# Patient Record
Sex: Male | Born: 1990 | Race: White | Hispanic: No | Marital: Single | Smoking: Never smoker
Health system: Southern US, Community
[De-identification: ages and names within clinical notes are randomized; demographics above are authoritative.]

## PROBLEM LIST (undated history)

## (undated) DIAGNOSIS — Q249 Congenital malformation of heart, unspecified: Secondary | ICD-10-CM

## (undated) DIAGNOSIS — L708 Other acne: Secondary | ICD-10-CM

## (undated) DIAGNOSIS — Q7150 Longitudinal reduction defect of unspecified ulna: Secondary | ICD-10-CM

## (undated) DIAGNOSIS — I639 Cerebral infarction, unspecified: Secondary | ICD-10-CM

## (undated) DIAGNOSIS — F909 Attention-deficit hyperactivity disorder, unspecified type: Secondary | ICD-10-CM

## (undated) DIAGNOSIS — M4120 Other idiopathic scoliosis, site unspecified: Secondary | ICD-10-CM

## (undated) HISTORY — DX: Longitudinal reduction defect of unspecified ulna: Q71.50

## (undated) HISTORY — DX: Congenital malformation of heart, unspecified: Q24.9

## (undated) HISTORY — PX: INGUINAL HERNIA REPAIR: SUR1180

## (undated) HISTORY — DX: Other acne: L70.8

## (undated) HISTORY — DX: Cerebral infarction, unspecified: I63.9

## (undated) HISTORY — DX: Other idiopathic scoliosis, site unspecified: M41.20

## (undated) HISTORY — PX: EYE SURGERY: SHX253

## (undated) HISTORY — PX: OTHER SURGICAL HISTORY: SHX169

## (undated) HISTORY — DX: Attention-deficit hyperactivity disorder, unspecified type: F90.9

## (undated) HISTORY — PX: WRIST SURGERY: SHX841

---
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
## 2008-03-24 ENCOUNTER — Emergency Department: Payer: Self-pay | Admitting: Emergency Medicine

## 2008-11-22 ENCOUNTER — Emergency Department: Payer: Self-pay | Admitting: Emergency Medicine

## 2010-11-01 NOTE — Discharge Summary (Signed)
NAME:  Roger Kirby, Roger Kirby                 ACCOUNT NO.:  [id number]   MEDICAL RECORD NO.:  [id number]          PATIENT TYPE:  IPS   LOCATION:  4005                         FACILITY:  [facility]   PHYSICIAN:  Ranelle Oyster, M.D.DATE OF BIRTH:  Nov 17, 1990   DATE OF ADMISSION:  06/06/2007  DATE OF DISCHARGE:  06/21/2007                               DISCHARGE SUMMARY   DISCHARGE DIAGNOSIS:  1. 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  2. Abnormal liver function tests.  3. Leukocytosis stable.  4. Wound dehiscence with some purulent drainage.   HISTORY OF PRESENT ILLNESS:  Mr. Roger Kirby is an 20 year old male with a  history of idiopathic scoliosis requiring T1-T12 fusion on December 3  approximately 36 hours postoperative the patient was noted to have acute  bilateral lower extremity, right greater than left, weakness with  numbness requiring hardware removal on 12/05.  CTA revealed high in  right ICA with suggestion of prior dissection.  Subtle increase was also  noted in cord edema at T2-1 x-rays.  CT of the aorta was done showing no  evidence of aortic dissection.  Currently the patient continues with  lower extremity weakness, and repeat MRI of the spine to be done in the  future to rule out infarct as no signs of vascular injury of current  workup.  The patient underwent reinsertion of posterior fusion from T1-  T12 on 12/11 and currently continues with weakness, right greater than  left lower extremity.  Sensation impaired from T10 down, bilateral  clonus noted.  Therapies are ongoing, and the patient is noted to have  elevated anxiety levels.  He has been able to sit at the edge of bed  with min-to-mod assist, and has been able to perform some transfers with  mod-assist.  Foley remains in place, currently.  Rehab consulted for  further therapies.   PAST MEDICAL HISTORY SIGNIFICANT FOR:  1. Repair of tetralogy of Fallot in 1984.  2. 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  3. Scoliosis with fusion on December 3.  4. Aberrant left subclavian artery.   ALLERGIES:  No known drug allergies.   FAMILY HISTORY:  Positive for seizures.   SOCIAL HISTORY:  The patient lives with grandmother and uncle in 1-level  home with 2-to-3 steps at entry.  He is a Holiday representative at [employer].  Does not use any tobacco or alcohol.  Family reports  history of ADHD.   HOSPITAL COURSE:  Mr. Roger Kirby was admitted to rehab on June 06, 2007 for inpatient therapies to consist of PT/OT daily.  Admission labs  done revealed hemoglobin 11.6, hematocrit 34.7, white count 11.1,  platelets 619.  Check of lytes revealed sodium 136, potassium 4.1,  chloride 100, CO2 26, BUN 10, and creatinine 0.52, glucose 114.  The  patient's back incision was noted to be healing well, and was noted to  be intact.  Sutures were noted to be intact, and these were deceased on  06/26/2007  without difficulty.  Foley was initially kept in  place.  Bowel program was initiated with Senna nightly and Dulcolax  suppository q.a.m. to avoid accidents.   Foley was discontinued on December 25 and voiding trial was initiated.  Initially the patient was noted to have problems voiding, and he was  started on Flomax 0.4 mg nightly in addition to Urecholine that has  slowly been titrated to 50 mg t.i.d.  By the time of discharge the  patient is voiding incontinently.  He has been able to self-cath for  high volumes.  Last PVR scans x2 checked have shown no signs of  retention.   On June 20, 2007 the patient was noted to have wound dehiscence  approximately 1-inch at lower aspect of his incision.  He was noted to  have some serosanguineous drainage with some purulence.  We have able to  express a moderate amount of purulent material from the sinus tract  central to with dehisced area.  The patient was started on Keflex 250 mg  b.i.d.  for wound prophylaxis and treatment.  Repeat CBC was done on  January 1 showing H&H at 12.7/37.6 with white count of 11.0, platelets  at 352.  UA/UC of December 30 was negative and no growth.  The patient  continues on Keflex for now.  Small amount of purulent drainage was  expressed from incision on January 2.  [facility], resident on  call, was contacted regarding ongoing issues regarding purulent drainage  as well as need for change of antibiotics.  They will follow up with the  patient in the walk-in clinic on June 23, 2006.  The patient is to  continue on Keflex for now.   The patient's family have been instructed for any issues regarding  fevers, chill, or mental status changes to contact [facility]  immediately for input on treatment.  Currently the patient continues  with lower extremity strength.  Grossly he is at 3 to 3+ strength in the  lower extremity.  He did feel deep pressure above knee, diminished  position sense above knee, and absent below knee.  Currently, the  patient is modified independent for static sitting balance, able to  reach 3 feet out of base support, max-assist with upper extremity  support for standing balance, and requires assist to keep the trunk  upright.  He is min-assist for sliding board transfers.  He is able to  stand with min-assist x2 for 1-2 minutes.  Currently the patient is at  supervision for upper body dressing at edge of bed, min-assist for  toileting, and for lower body dressing, mod-assist for hygiene.   Family education has been completed with father and uncle.  He will also  continue to receive further follow up home health PT/OT by [facility].  Home health RN has been set up for wound care, and wound  monitoring.  On June 20, 2006 the patient is discharged to home.   DISCHARGE MEDICATIONS:  1. Adaril 1 p.o.  2. Multivitamin 1 per day.  3. Senokot-S two p.o. nightly.  4. Keflex 250 mg p.o. q.6 h.  5. OxyIR 5 mg  one q.8-12 h. p.r.n. pain #30.  6. Neurontin 100 mg b.i.d.  7. Dulcolax suppository 1 per rectum q.a.m.  8. Baclofen 10 mg one-half p.o. b.i.d.  9. Urecholine 50 mg q.8 h.  10.Flomax 0.4 mg nightly.  11.Tylenol as needed for pain.   DIET:  Regular.   WOUND CARE:  1. Dressing changes b.i.d.  2. Push on lower aspect of incision to express all pus.  3. Cleanse the area with Betadine and apply dry dressing.   ACTIVITY LEVEL:  24-hour supervision assistance.  No alcohol, no  smoking, no driving.  Routine back precautions.  [facility] to  provide PT, OT, RN.   SPECIAL INSTRUCTIONS:  To call Kennon Rounds at Dr. Vern[facility] Shanks clinic regarding  walk-in time on Monday January 5.  The family has been instructed to  call at 8:00 a.m. at [phone], to call sooner for any complications.   FOLLOWUP:  The patient to followup with Dr. Riley Kill for routine check  February 6 at 1:20.      Greg Cutter, P.A.      Ranelle Oyster, M.D.  Electronically Signed    PP/MEDQ  D:  06/21/2007  T:  06/21/2007  Job:  [id number]

## 2010-11-01 NOTE — H&P (Signed)
NAME:  Roger Kirby, Roger Kirby                 ACCOUNT NO.:  [id number]   MEDICAL RECORD NO.:  [id number]          PATIENT TYPE:  IPS   LOCATION:  4005                         FACILITY:  [facility]   PHYSICIAN:  Ellwood Dense, M.D.   DATE OF BIRTH:  [birth year]   DATE OF ADMISSION:  06/06/2007  DATE OF DISCHARGE:                              HISTORY & PHYSICAL   PRIMARY CARE PHYSICIAN:  Dr. Alla Feeling at [facility].   HISTORY OF PRESENT ILLNESS:  Roger Kirby is an 20 year old Caucasian male  with history of idiopathic scoliosis requiring prior T1-T12 fusion  May 22, 2007, at [facility].   Approximately 36 hours after surgery, the patient developed acute  bilateral lower extremity, right greater than left, weakness and  numbness requiring hardware removal May 24, 2007.   CAT scan angiography revealed right internal carotid artery symptoms  suggestive of prior dissection.  It is a subtle increasing in cord edema  at T2 on x-ray.  He continued with lower extremity weakness, and a  repeat MRI scan was recommended, if there were further problems to rule  out infarct.  An MRI scan of the brain was done with unknown findings.   The patient underwent reinsertion of posterior fusion hardware T1-T12 on  May 30, 2007.  Her currently has weakness in the right lower  extremity more than the left lower extremity with sensation impaired  from approximately T8-T10 down.  Bilateral clonus was noted at the  ankles.  Physical and occupational therapy have been evaluating the  patient, and he continues with anxiety with poor ability to sit  unassisted, requiring min to mod assist for sitting, and mod assist for  transfers with increased clonus of the bilateral lower extremities.   The patient was evaluated by the rehabilitation physician, and felt to  be an appropriate candidate for inpatient rehabilitation.   REVIEW OF SYSTEMS:  Positive for incontinence, weakness and  numbness.   PAST MEDICAL HISTORY:  1. Repair of tetralogy of Fallot in 1984.  2. Left-sided radial aplasia, status post centralization in 1995, with      scoliosis with fusion May 22, 2007, at [facility], mixed      aberrant left subclavian artery.   FAMILY HISTORY:  Noncontributory.   SOCIAL HISTORY:  The patient reportedly lives with his grandmother and  uncle.  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  He does not use alcohol or tobacco.  He lives in 1- level home with 2-3  steps to enter.  He was independent, attending school prior to  admission.  He uses mostly his right upper extremity for all activities  of daily living, as his left upper extremities is only minimally  functional.   ALLERGIES:  No known drug allergies.   MEDICATIONS:  Prior to admission unknown.   LABORATORY:  Unknown.   PHYSICAL EXAM:  Anxious, occasionally tearful, young adult male, lying  in bed, in mild acute discomfort.  Blood pressure was 117/69 with pulse of 85, respiratory rate 20, O2  saturation 97% on room air, and  temperature 97.6.  HEENT:  Normocephalic, nontraumatic.  CARDIOVASCULAR:  Regular rate and rhythm, S1, S2 without murmurs.  ABDOMEN:  Soft, nontender, with positive bowel sounds.  LUNGS:  Clear to auscultation bilaterally.  NEUROLOGIC:  Alert, oriented x 2-3 with occasional cues.  Cranial nerve  exam showed intact cranial nerves II-XII.  Left upper extremity strength  was generally 3+/5 to 4-/5 with obvious deformity of his left upper  extremity, especially distally where he has a poorly formed elbow and  wrist, and minimally functional hand digits.  Right upper extremity  strength was generally 4+/5.  Lower extremity exam showed decreased  sensation below approximately T8-T10.  He has strength of 3-/5 in the  bilateral lower extremities.  He has bilateral clonus noted at the  ankles.  EXAMINATION OF HIS BACK:  Showed sutures in place without  significant  drainage, with the incision extending approximately 12-15 inches in his  mid back.   IMPRESSION:  1. Status post reinsertion of hardware May 30, 2007, for      treatment of thoracic scoliosis with history of prior surgery for      tetralogy of Fallot in 82.  2. 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  3. Anxiety/ behavior disorder.   Presently, the patient has deficits in ADLs, transfers and ambulation  related to the above noted thoracic scoliosis surgery.   PLAN:  1. Admit to the rehabilitation unit for daily therapies to include      physical therapy for range of motion, strengthening, bed mobility,      transfers, pre-gait training, gait training and equipment eval.  2. Occupational therapy for range of motion, strengthening, ADLs,      cognitive/perceptual training, splinting and equipment eval.  3. Rehab nursing for skin care, wound care, and bound abd bladder      training as necessary.  4. Case management to assess home environment, assist with discharge      planning and arrange for appropriate followup care.  5. Social work to assess family and social support, and assist in      discharge planning.  6. Continue regular pediatric diet.  7. Incentive spirometry q.2 hours while awake.  8. Oxycodone 5-mg elixir p.o. q.4 hours p.r.n. for pain.  9. PRAFO to the right lower extremity.  Senokot-S 2 tablets p.o.      nightly.  10.Dulcolax suppository 1 per rectum q. day at 6 a.m.  11.Multivitamin daily.  12.Discontinue sutures and Steri-Strips in incision is back June 08, 2007.  13.Adderall 10 mg p.o.  14.Cleanse incision in the back with normal saline daily, and apply      dry dressing until sutures removed.  15.Keep head of bed 20-30 degrees for patient's comfort.  16.Foley to straight drainage.  17.Lovenox 40 mg subcu q. day.  18.One-on-one counseling with  Dr. Leonides Cave for coping and anxiety.  19.Baclofen 5 mg p.o. b.i.d.  20.Orthostatic blood pressure and pulse q.a.m.  21.Vitals q. shift.  22.Thigh-high TEDs and abdominal binder for significant orthostasis      changes.   PROGNOSIS:  Fair.   ESTIMATED LENGTH OF STAY:  15-25 days.   GOALS:  Modified dependent ADLs and standby assist to min assist at  transfers and short-distance ambulation, and modified independent  wheelchair mobility.           ______________________________  Ellwood Dense, M.D.  DC/MEDQ  D:  06/07/2007  T:  06/08/2007  Job:  [id number]

## 2010-11-14 ENCOUNTER — Emergency Department: Payer: Self-pay | Admitting: Emergency Medicine

## 2011-02-10 ENCOUNTER — Emergency Department: Payer: Self-pay | Admitting: *Deleted

## 2011-03-09 LAB — CBC
MCHC: 33.7
MCV: 86.1
Platelets: 352
RBC: 4.37

## 2011-03-09 LAB — DIFFERENTIAL
Basophils Relative: 0
Eosinophils Absolute: 0.3
Monocytes Relative: 9
Neutrophils Relative %: 62

## 2011-03-24 LAB — CBC
Hemoglobin: 12 — ABNORMAL LOW
MCHC: 33.4
Platelets: 619 — ABNORMAL HIGH
RDW: 13.4
RDW: 13.6
WBC: 10

## 2011-03-24 LAB — COMPREHENSIVE METABOLIC PANEL
ALT: 170 — ABNORMAL HIGH
AST: 67 — ABNORMAL HIGH
Calcium: 8.4
GFR calc Af Amer: >60
Sodium: 136
Total Protein: 5.6 — ABNORMAL LOW

## 2011-03-24 LAB — URINALYSIS, ROUTINE W REFLEX MICROSCOPIC
Glucose, UA: NEGATIVE
Hgb urine dipstick: NEGATIVE
Ketones, ur: NEGATIVE
Protein, ur: NEGATIVE
Protein, ur: NEGATIVE
Urobilinogen, UA: 1

## 2011-03-24 LAB — URINE CULTURE
Colony Count: NO GROWTH
Colony Count: NO GROWTH
Culture: NO GROWTH
Culture: NO GROWTH

## 2011-03-24 LAB — DIFFERENTIAL
Basophils Absolute: 0
Basophils Relative: 0
Lymphocytes Relative: 13
Lymphs Abs: 1.5
Monocytes Relative: 8

## 2011-03-24 LAB — URINE MICROSCOPIC-ADD ON

## 2014-01-01 LAB — TSH: TSH: 1.85 u[IU]/mL (ref <=5.90)

## 2014-01-13 ENCOUNTER — Encounter: Payer: Self-pay | Admitting: *Deleted

## 2014-01-14 ENCOUNTER — Encounter (INDEPENDENT_AMBULATORY_CARE_PROVIDER_SITE_OTHER): Payer: Self-pay

## 2014-01-14 ENCOUNTER — Encounter: Payer: Self-pay | Admitting: Cardiology

## 2014-01-14 ENCOUNTER — Ambulatory Visit (INDEPENDENT_AMBULATORY_CARE_PROVIDER_SITE_OTHER): Payer: Medicare Other | Admitting: Cardiology

## 2014-01-14 VITALS — BP 142/82 | HR 73 | Ht 65.0 in | Wt 117.2 lb

## 2014-01-14 DIAGNOSIS — R0989 Other specified symptoms and signs involving the circulatory and respiratory systems: Secondary | ICD-10-CM

## 2014-01-14 DIAGNOSIS — I452 Bifascicular block: Secondary | ICD-10-CM

## 2014-01-14 DIAGNOSIS — R079 Chest pain, unspecified: Secondary | ICD-10-CM

## 2014-01-14 DIAGNOSIS — Q249 Congenital malformation of heart, unspecified: Secondary | ICD-10-CM

## 2014-01-14 DIAGNOSIS — R011 Cardiac murmur, unspecified: Secondary | ICD-10-CM

## 2014-01-14 DIAGNOSIS — R0609 Other forms of dyspnea: Secondary | ICD-10-CM

## 2014-01-14 DIAGNOSIS — R0600 Dyspnea, unspecified: Secondary | ICD-10-CM

## 2014-01-14 NOTE — Patient Instructions (Signed)
We will call you with follow up instructions

## 2014-01-14 NOTE — Progress Notes (Signed)
PATIENT: Roger Holmsavid Densmore Jr. MRN: [id number] DOB: 03/20/1991 PCP: Tommy RainwaterSOWLES,KRICKNA F, MD  Clinic Note: Chief Complaint  Patient presents with  . other    C/o chest pain and sob. Meds reviewed verbally with pt.    HPI: Roger HolmsDavid Hissong Jr. is a 23 y.o. male with a PMH below who presents today for evaluation of episodic chest pain and dyspnea. He has a history of some congenital birth defects one of which involves the left arm the other apparently was congenital heart defect. Unfortunately the family is unable to provide any details as to what type of heart surgery he had at age 89. They don't describe any symptoms of TET spells, but on reviewing some of his chart there was a suspicion of tetralogy. He essentially did not see his cardiologist since age 89.  Interval History: He is referred by his primary physician for her some vague symptoms of the head and chest discomfort could not really specific. He tells me that every now and then he gets short of breath whether he is doing any activity or not. Is not associated with PND or orthopnea although sometimes he can feel that. Sometimes will feel brief episodes of discomfort and heaviness in his chest but not again associated with exercise or particular activity. He'll occasionally feel some palpitations but no prolonged arrhythmia type rapid or irregular heartbeats. Additionally he also notes that he intermittently feels as though he has poor energy. I reviewed his sleep habits and does appear that he is not getting enough sleep the course of the night either from staying up late or waking up early. He denies any PND, orthopnea or edema there is persistent. No syncope or near syncope or TIA/amaurosis fugax symptoms.  Past Medical History  Diagnosis Date  . Scoliosis (and kyphoscoliosis), idiopathic   . 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 Attention deficit disorder with  hyperactivity(314.01)   . Other acne   . CVA (cerebrovascular accident)     after scollosis repair  . 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 Harrington rods    . Inguinal hernia repair    . Congenital heart surgery  1994    Questionable tetralogy of flow repair versus VSD  . Wrist surgery      No Known Allergies  No current outpatient prescriptions on file.   No current facility-administered medications for this visit.   History   Social History Narrative   He is single young man who never smoked. His father does smoke. He works at [employer].    family history includes Heart attack in his father; Hyperlipidemia in his father; Hypertension in his father.  ROS: A comprehensive Review of Systems was performed -  Review of Systems  Constitutional: Positive for malaise/fatigue.  Respiratory: Positive for shortness of breath.   Cardiovascular: Positive for chest pain and palpitations. Negative for claudication and leg swelling.  Neurological: Negative for speech change, focal weakness and weakness.  All other systems reviewed and are negative.  PHYSICAL EXAM BP 142/82  Pulse 73  Ht 5\' 5"  (1.651 m)  Wt 117 lb 4 oz (53.184 kg)  BMI 19.51 kg/m2 General appearance: alert, cooperative, appears stated age, no distress and Well-nourished and well-groomed Neck: no adenopathy,  no carotid bruit, no JVD, supple, symmetrical, trachea midline and thyroid not enlarged, symmetric, no tenderness/mass/nodules Lungs: clear to auscultation bilaterally, normal percussion bilaterally and Nonlabored, good air movement Heart: RRR with normal S1 and S2. He does have a significant murmur that appears to be holosystolic loudest at the left sternal border. It is at least 306. I was not able to elicit  any changes in the murmur with Valsalva or deep inspiration. His chest cavity appears displaced superiorly where his upper ribs are very high. He did does appear to be some mild protuberance of the left parasternal rib cage suggesting a possible RV enlargement. I do not feel RV heave. Abdomen: soft, non-tender; bowel sounds normal; no masses,  no organomegaly Extremities: Jeremy Johann are normal with no edema. 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   Pulses: 2+ and symmetric Neurologic: Alert and oriented X 3, normal strength and tone. Normal symmetric reflexes. Normal coordination and gait   Adult ECG Report  Rate: 73 ;  Rhythm: normal sinus rhythm and With RBBB and LAFB  Recent Labs: None  ASSESSMENT / PLAN: Variation in young man atypical sounding mild symptoms. My biggest concern is that he has not had any cardiac evaluation since age 26. He's had some type of congenital heart surgery of which I am not sure. He probably needs to have an echocardiogram checked just to make sure that his dyspnea and chest discomfort is not related to something from his heart surgery unfortunately we do not have either echocardiography technologist or cardiologists to accurately performed or read congenital heart defect echocardiography. His surgery was performed at [facility] and was followed initially by [facility]. I contacted their office and plan to refer him to be seen by one of the pediatric cardiologists and likely have an echocardiogram performed and read by them. Until I know for sure that we are not dealing with something that relates to his congenital defect and surgery, I do not feel comfortable as an adult cardiologist managing this young man.  He and his father agree that this may be the best course of action.  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 In light of his symptoms of dyspnea and chest discomfort I think it warrants at least an echocardiogram. Will Refer to [facility] for establishing followup care and echocardiographic evaluation.  They will have to get his charts out of archive.  Heart murmur, systolic He definitely has a significant murmur not sure, systoles into his previous surgery or if this is something new. Warrants echocardiographic evaluation by someone trained in reading pediatric echocardiography.  Dyspnea Episodic and not necessarily exertional.  This doesn't seem to bother him for a much, but is somewhat unusual in someone 23 years old  Pain in the chest I doubt very seriously this is anginal pain. I would like to know more about his cardiac function and this rupture before even considering some type of stress test evaluation    Orders Placed This Encounter  Procedures  . EKG 12-Lead    Order Specific Question:  Where should this test be performed    Answer:  [facility]   No orders of the defined types were placed in this encounter.    Followup: Will likely not followup with me depending on how his pediatric  cardiology evaluation goes   Jabarie W. Herbie Baltimore, M.D., M.S. Interventional Cardiolgy [facility]

## 2014-01-16 ENCOUNTER — Encounter: Payer: Self-pay | Admitting: Cardiology

## 2014-01-16 DIAGNOSIS — R0600 Dyspnea, unspecified: Secondary | ICD-10-CM | POA: Insufficient documentation

## 2014-01-16 DIAGNOSIS — R011 Cardiac murmur, unspecified: Secondary | ICD-10-CM | POA: Insufficient documentation

## 2014-01-16 DIAGNOSIS — I452 Bifascicular block: Secondary | ICD-10-CM | POA: Insufficient documentation

## 2014-01-16 DIAGNOSIS — R079 Chest pain, unspecified: Secondary | ICD-10-CM | POA: Insufficient documentation

## 2014-01-16 NOTE — Assessment & Plan Note (Signed)
He definitely has a significant murmur not sure, systoles into his previous surgery or if this is something new. Warrants echocardiographic evaluation by someone trained in reading pediatric echocardiography.

## 2014-01-16 NOTE — Assessment & Plan Note (Signed)
No evaluation since age 89. In light of his symptoms of dyspnea and chest discomfort I think it warrants at least an echocardiogram. Will [facility] for establishing followup care and echocardiographic evaluation.  They will have to get his charts out of archive.

## 2014-01-16 NOTE — Assessment & Plan Note (Signed)
I doubt very seriously this is anginal pain. I would like to know more about his cardiac function and this rupture before even considering some type of stress test evaluation

## 2014-01-16 NOTE — Assessment & Plan Note (Signed)
Episodic and not necessarily exertional.  This doesn't seem to bother him for a much, but is somewhat unusual in someone 23 years old

## 2014-01-19 ENCOUNTER — Telehealth: Payer: Self-pay | Admitting: *Deleted

## 2014-01-19 NOTE — Telephone Encounter (Signed)
Patient called regarding referral to pediatric cardiologist.

## 2014-01-20 NOTE — Telephone Encounter (Signed)
-   yeah - @ [facility] in 1994.  Plan was just to refer [facility]. Can just call the # you gave me - then fax his face sheet to them.  Marykay LexHARDING,Kennieth W, MD

## 2014-01-20 NOTE — Telephone Encounter (Signed)
Contacted [facility]  They informed me that Dr. Vanetta ShawlFeins performed the patients surgery  His [facility] med rec# is [id number] They stated patient needed to be referred to [facility] I called the number given [phone]  They asked me to fax the patients information to [phone] They stated that they would contact patient to set up the appt   I contacted patient to give him this information  I gave him the phone number for [facility] and instructed him to call if he did not hear form them in a few days for an appt  Instructed patient to call me with any issues  Patient verbalized understanding

## 2014-01-20 NOTE — Telephone Encounter (Signed)
Great - that makes sense.  Not exactly what they told me - but makes more sense .  Thanks.  DH

## 2014-03-16 ENCOUNTER — Emergency Department: Payer: Self-pay | Admitting: Emergency Medicine

## 2014-03-16 LAB — BASIC METABOLIC PANEL
Anion Gap: 7 (ref 7–16)
BUN: 13 mg/dL (ref 7–18)
CALCIUM: 8.7 mg/dL (ref 8.5–10.1)
CHLORIDE: 105 mmol/L (ref 98–107)
CREATININE: 1 mg/dL (ref 0.60–1.30)
Co2: 28 mmol/L (ref 21–32)
EGFR (African American): >60
GLUCOSE: 114 mg/dL — AB (ref 65–99)
Osmolality: 280 (ref 275–301)
POTASSIUM: 4.3 mmol/L (ref 3.5–5.1)
Sodium: 140 mmol/L (ref 136–145)

## 2014-03-16 LAB — CBC
HCT: 45.7 % (ref 40.0–52.0)
HGB: 15.2 g/dL (ref 13.0–18.0)
MCH: 29 pg (ref 26.0–34.0)
MCHC: 33.3 g/dL (ref 32.0–36.0)
MCV: 87 fL (ref 80–100)
PLATELETS: 245 10*3/uL (ref 150–440)
RBC: 5.25 10*6/uL (ref 4.40–5.90)
RDW: 12.9 % (ref 11.5–14.5)
WBC: 12 10*3/uL — ABNORMAL HIGH (ref 3.8–10.6)

## 2014-03-16 LAB — D-DIMER(ARMC): D-Dimer: 98 ng/ml

## 2014-03-16 LAB — TROPONIN I: Troponin-I: <0.02 ng/mL

## 2014-07-27 ENCOUNTER — Encounter (HOSPITAL_COMMUNITY): Payer: Self-pay | Admitting: *Deleted

## 2014-07-27 ENCOUNTER — Emergency Department (HOSPITAL_COMMUNITY)
Admission: EM | Admit: 2014-07-27 | Discharge: 2014-07-27 | Disposition: A | Discharge status: Home / Self Care | Payer: Medicare Other | Attending: Emergency Medicine | Admitting: Emergency Medicine

## 2014-07-27 ENCOUNTER — Emergency Department (HOSPITAL_COMMUNITY): Payer: Medicare Other

## 2014-07-27 DIAGNOSIS — Z79899 Other long term (current) drug therapy: Secondary | ICD-10-CM | POA: Diagnosis not present

## 2014-07-27 DIAGNOSIS — R200 Anesthesia of skin: Secondary | ICD-10-CM | POA: Diagnosis not present

## 2014-07-27 DIAGNOSIS — Q7150 Longitudinal reduction defect of unspecified ulna: Secondary | ICD-10-CM | POA: Insufficient documentation

## 2014-07-27 DIAGNOSIS — Z8774 Personal history of (corrected) congenital malformations of heart and circulatory system: Secondary | ICD-10-CM | POA: Diagnosis not present

## 2014-07-27 DIAGNOSIS — M5489 Other dorsalgia: Secondary | ICD-10-CM | POA: Diagnosis not present

## 2014-07-27 DIAGNOSIS — Z791 Long term (current) use of non-steroidal anti-inflammatories (NSAID): Secondary | ICD-10-CM | POA: Diagnosis not present

## 2014-07-27 DIAGNOSIS — M62830 Muscle spasm of back: Secondary | ICD-10-CM

## 2014-07-27 DIAGNOSIS — R569 Unspecified convulsions: Secondary | ICD-10-CM | POA: Diagnosis present

## 2014-07-27 LAB — COMPREHENSIVE METABOLIC PANEL
ALBUMIN: 4.1 g/dL (ref 3.5–5.2)
ALT: 28 U/L (ref 0–53)
ANION GAP: 3 — AB (ref 5–15)
AST: 24 U/L (ref 0–37)
Alkaline Phosphatase: 78 U/L (ref 39–117)
BILIRUBIN TOTAL: 0.4 mg/dL (ref 0.3–1.2)
BUN: 16 mg/dL (ref 6–23)
CHLORIDE: 106 mmol/L (ref 96–112)
CO2: 28 mmol/L (ref 19–32)
Calcium: 9.2 mg/dL (ref 8.4–10.5)
Creatinine, Ser: 1 mg/dL (ref 0.50–1.35)
GFR calc Af Amer: >90 mL/min (ref >90)
Glucose, Bld: 121 mg/dL — ABNORMAL HIGH (ref 70–99)
POTASSIUM: 3.8 mmol/L (ref 3.5–5.1)
Sodium: 137 mmol/L (ref 135–145)
TOTAL PROTEIN: 7.1 g/dL (ref 6.0–8.3)

## 2014-07-27 LAB — CBC WITH DIFFERENTIAL/PLATELET
BASOS PCT: 0 % (ref 0–1)
Basophils Absolute: 0.1 10*3/uL (ref 0.0–0.1)
EOS PCT: 4 % (ref 0–5)
Eosinophils Absolute: 0.5 10*3/uL (ref 0.0–0.7)
HEMATOCRIT: 40.5 % (ref 39.0–52.0)
HEMOGLOBIN: 14 g/dL (ref 13.0–17.0)
Lymphocytes Relative: 20 % (ref 12–46)
Lymphs Abs: 2.9 10*3/uL (ref 0.7–4.0)
MCH: 29.1 pg (ref 26.0–34.0)
MCHC: 34.6 g/dL (ref 30.0–36.0)
MCV: 84.2 fL (ref 78.0–100.0)
MONOS PCT: 10 % (ref 3–12)
Monocytes Absolute: 1.5 10*3/uL — ABNORMAL HIGH (ref 0.1–1.0)
NEUTROS ABS: 9.4 10*3/uL — AB (ref 1.7–7.7)
Neutrophils Relative %: 66 % (ref 43–77)
Platelets: 272 10*3/uL (ref 150–400)
RBC: 4.81 MIL/uL (ref 4.22–5.81)
RDW: 12.8 % (ref 11.5–15.5)
WBC: 14.4 10*3/uL — AB (ref 4.0–10.5)

## 2014-07-27 LAB — URINALYSIS, ROUTINE W REFLEX MICROSCOPIC
Bilirubin Urine: NEGATIVE
GLUCOSE, UA: NEGATIVE mg/dL
Hgb urine dipstick: NEGATIVE
KETONES UR: NEGATIVE mg/dL
Leukocytes, UA: NEGATIVE
Nitrite: NEGATIVE
PH: 6.5 (ref 5.0–8.0)
PROTEIN: NEGATIVE mg/dL
SPECIFIC GRAVITY, URINE: 1.023 (ref 1.005–1.030)
UROBILINOGEN UA: 1 mg/dL (ref 0.0–1.0)

## 2014-07-27 MED ORDER — NAPROXEN 500 MG PO TABS: 500.0000 mg | ORAL_TABLET | Freq: Two times a day (BID) | ORAL | Status: AC

## 2014-07-27 MED ORDER — CYCLOBENZAPRINE HCL 10 MG PO TABS
5.0000 mg | ORAL_TABLET | Freq: Once | ORAL | Status: AC
  Administered 2014-07-27: 5 mg via ORAL
  Filled 2014-07-27: qty 1

## 2014-07-27 MED ORDER — CYCLOBENZAPRINE HCL 5 MG PO TABS
5.0000 mg | ORAL_TABLET | Freq: Three times a day (TID) | ORAL | Status: AC | PRN
Start: 2014-07-27 — End: ?

## 2014-07-27 MED ORDER — OXYCODONE-ACETAMINOPHEN 5-325 MG PO TABS
1.0000 | ORAL_TABLET | Freq: Once | ORAL | Status: AC
  Administered 2014-07-27: 1 via ORAL
  Filled 2014-07-27: qty 1

## 2014-07-27 MED ORDER — OXYCODONE-ACETAMINOPHEN 5-325 MG PO TABS: 1.0000 | ORAL_TABLET | Freq: Four times a day (QID) | ORAL | Status: AC | PRN

## 2014-07-27 NOTE — ED Notes (Signed)
The pt had a seizure earlier tonight noiw has numbness rt arm and rt leg.  No seizure since he was a child.  Stroke as a baby.

## 2014-07-27 NOTE — ED Provider Notes (Signed)
CSN: [id number]     Arrival date & time 07/27/14  0026 History   First MD Initiated Contact with Patient 07/27/14 [id number]     Chief Complaint  Patient presents with  . Seizures     (Consider location/radiation/quality/duration/timing/severity/associated sxs/prior Treatment) HPI  Pt presenting with c/o low back pain and muscle spasm in his back.  He states that he was seated on the edge of the couch and had a spasm of pain in his back- this caused him to fall to the floor.  He felt that his whole body was "clenched up" and he was not able to move due to the spasm.  Also c/o numbness in right hand and right foot.  No weakness of legs. He has hx of scoliosis and subsequent repair.  He states he has had similar symptoms in the past with numbness and spasms that improve on their own.  He has some residual weakness in legs after spine surgery but no change in this.  No seizure activity.  He states he has had hx of CVA- however per chart review he has had ?spinal infarct after surgery- no actual CVA.  Pt is currently back at his baseline on exam in the ED.  No fever.  Pain is in his lower back and right lower back feels tight like a spasm per patient.  There are no other associated systemic symptoms, there are no other alleviating or modifying factors.   Past Medical History  Diagnosis Date  . Scoliosis (and kyphoscoliosis), idiopathic   . 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 Attention deficit disorder with hyperactivity(314.01)   . Other acne   . CVA (cerebrovascular accident)     after scollosis repair  . 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 Harrington rods    . Inguinal hernia repair    . Congenital heart surgery  1994    Questionable tetralogy of flow repair versus  VSD  . Wrist surgery     Family History  Problem Relation Age of Onset  . Hypertension Father   . Heart attack Father   . Hyperlipidemia Father    History  Substance Use Topics  . Smoking status: Never Smoker   . Smokeless tobacco: Not on file  . Alcohol Use: Yes     Comment: occasional    Review of Systems  ROS reviewed and all otherwise negative except for mentioned in HPI    Allergies  Review of patient's allergies indicates no known allergies.  Home Medications   Prior to Admission medications   Medication Sig Start Date End Date Taking? Authorizing Provider  cyclobenzaprine (FLEXERIL) 5 MG tablet Take 1 tablet (5 mg total) by mouth 3 (three) times daily as needed for muscle spasms. 07/27/14   Ethelda ChickMartha K Linker, MD  naproxen (NAPROSYN) 500 MG tablet Take 1 tablet (500 mg total) by mouth 2 (two) times daily. 07/27/14   Ethelda ChickMartha K Linker, MD  oxyCODONE-acetaminophen (PERCOCET/ROXICET) 5-325 MG per tablet Take 1-2 tablets by mouth every 6 (six) hours as needed for severe pain. 07/27/14   Ethelda ChickMartha K Linker, MD   BP 131/76 mmHg  Pulse 86  Temp(Src) 97.7 F (36.5 C) (Oral)  Resp 21  Ht 5\' 9"  (1.753 m)  Wt 130 lb (58.968 kg)  BMI  19.19 kg/m2  SpO2 98%  Vitals reviewed Physical Exam  Physical Examination: General appearance - alert, chronically ill  appearing, and in no distress Mental status - alert, oriented to person, place, and time Eyes - pupils equal and reactive, extraocular eye movements intact Mouth - mucous membranes moist, pharynx normal without lesions Chest - clear to auscultation, no wheezes, rales or rhonchi, symmetric air entry, pectus excavatum, well healed midline sternotomy scar Heart - normal rate, regular rhythm, normal S1, S2, no murmurs, rubs, clicks or gallops Back exam - some ttp over right lumbar paraspinal region, no midline tenderness to palpation Neurological - alert, oriented x 3, no cranial nerve deficit, left upper extremity with chronic  contractures, other extremities x 3 with 4+/5 strength, sensation intact to light touch over right upper and right lower extremity Extremities - peripheral pulses normal, no pedal edema, no clubbing or cyanosis Skin - normal coloration and turgor, no rashes  ED Course  Procedures (including critical care time) Labs Review Labs Reviewed  CBC WITH DIFFERENTIAL/PLATELET - Abnormal; Notable for the following:    WBC 14.4 (*)    Neutro Abs 9.4 (*)    Monocytes Absolute 1.5 (*)    All other components within normal limits  COMPREHENSIVE METABOLIC PANEL - Abnormal; Notable for the following:    Glucose, Bld 121 (*)    Anion gap 3 (*)    All other components within normal limits  URINALYSIS, ROUTINE W REFLEX MICROSCOPIC    Imaging Review Ct Head Wo Contrast  07/27/2014   CLINICAL DATA:  Recent seizure. Acute onset of right arm and right leg numbness. Initial encounter.  EXAM: CT HEAD WITHOUT CONTRAST  TECHNIQUE: Contiguous axial images were obtained from the base of the skull through the vertex without intravenous contrast.  COMPARISON:  None.  FINDINGS: There is no evidence of acute infarction, mass lesion, or intra- or extra-axial hemorrhage on CT.  The posterior fossa, including the cerebellum, brainstem and fourth ventricle, is within normal limits. The third and lateral ventricles, and basal ganglia are unremarkable in appearance. The cerebral hemispheres are symmetric in appearance, with normal gray-white differentiation. No mass effect or midline shift is seen.  There is no evidence of fracture; minimal lucency within the right frontal calvarium is thought to reflect benign fatty replacement. The orbits are within normal limits. There is opacification of the left mastoid air cells, extending to the posterior aspect of the middle ear. The paranasal sinuses and right mastoid air cells are well-aerated. No significant soft tissue abnormalities are seen.  IMPRESSION: 1. No acute intracranial  pathology seen on CT. 2. Opacification of the left mastoid air cells, extending to the posterior aspect of the middle ear. The left ossicles remain intact. Would correlate for any evidence of ear infection.   Electronically Signed   By: Roanna Raider M.D.   On: 07/27/2014 01:27     EKG Interpretation None      MDM   Final diagnoses:  Other back pain  Muscle spasm of back    Pt presenting with c/o right hand and foot numbness after spasm of back pain caused him to fall to the floor from the couch.  He has neuro exam that is baseline for him in the ED.  I do not feel this represents a seizure.  He states he has had similar spasms in his back in the past.  Labs and head CT reassuring.  Pt treated with pain meds and muscle relaxer.  Discharged with strict  return precautions.  Pt agreeable with plan.    Ethelda Chick, MD 07/27/14 [phone]

## 2014-07-27 NOTE — ED Notes (Signed)
Patient requesting to see MD prior to discharge for review of findings. MD Linker informed.

## 2014-07-27 NOTE — Discharge Instructions (Signed)
Return to the ED with any concerns including weakness of legs, fainting, seizure activity, not able to urinate, loss of control of bowel or bladder, decreased level of alertness/lethargy, or any other alarming symptoms

## 2014-07-31 ENCOUNTER — Emergency Department (HOSPITAL_COMMUNITY): Payer: Medicare Other

## 2014-07-31 ENCOUNTER — Emergency Department (HOSPITAL_COMMUNITY)
Admission: EM | Admit: 2014-07-31 | Discharge: 2014-08-01 | Disposition: A | Discharge status: Home / Self Care | Payer: Medicare Other | Attending: Emergency Medicine | Admitting: Emergency Medicine

## 2014-07-31 ENCOUNTER — Encounter (HOSPITAL_COMMUNITY): Payer: Self-pay | Admitting: Emergency Medicine

## 2014-07-31 DIAGNOSIS — Z8774 Personal history of (corrected) congenital malformations of heart and circulatory system: Secondary | ICD-10-CM | POA: Insufficient documentation

## 2014-07-31 DIAGNOSIS — R200 Anesthesia of skin: Secondary | ICD-10-CM | POA: Diagnosis not present

## 2014-07-31 DIAGNOSIS — R531 Weakness: Secondary | ICD-10-CM | POA: Diagnosis not present

## 2014-07-31 DIAGNOSIS — Z79899 Other long term (current) drug therapy: Secondary | ICD-10-CM | POA: Insufficient documentation

## 2014-07-31 DIAGNOSIS — D72829 Elevated white blood cell count, unspecified: Secondary | ICD-10-CM | POA: Insufficient documentation

## 2014-07-31 DIAGNOSIS — Z8659 Personal history of other mental and behavioral disorders: Secondary | ICD-10-CM | POA: Diagnosis not present

## 2014-07-31 DIAGNOSIS — M546 Pain in thoracic spine: Secondary | ICD-10-CM

## 2014-07-31 DIAGNOSIS — Z872 Personal history of diseases of the skin and subcutaneous tissue: Secondary | ICD-10-CM | POA: Insufficient documentation

## 2014-07-31 DIAGNOSIS — Z8776 Personal history of (corrected) congenital malformations of integument, limbs and musculoskeletal system: Secondary | ICD-10-CM | POA: Diagnosis not present

## 2014-07-31 DIAGNOSIS — Z8739 Personal history of other diseases of the musculoskeletal system and connective tissue: Secondary | ICD-10-CM | POA: Insufficient documentation

## 2014-07-31 DIAGNOSIS — Z8673 Personal history of transient ischemic attack (TIA), and cerebral infarction without residual deficits: Secondary | ICD-10-CM | POA: Insufficient documentation

## 2014-07-31 DIAGNOSIS — Z791 Long term (current) use of non-steroidal anti-inflammatories (NSAID): Secondary | ICD-10-CM | POA: Diagnosis not present

## 2014-07-31 DIAGNOSIS — R110 Nausea: Secondary | ICD-10-CM | POA: Insufficient documentation

## 2014-07-31 LAB — COMPREHENSIVE METABOLIC PANEL
ALBUMIN: 4.2 g/dL (ref 3.5–5.2)
ALT: 29 U/L (ref 0–53)
AST: 26 U/L (ref 0–37)
Alkaline Phosphatase: 77 U/L (ref 39–117)
Anion gap: 5 (ref 5–15)
BUN: 14 mg/dL (ref 6–23)
CALCIUM: 9.7 mg/dL (ref 8.4–10.5)
CO2: 28 mmol/L (ref 19–32)
Chloride: 105 mmol/L (ref 96–112)
Creatinine, Ser: 0.81 mg/dL (ref 0.50–1.35)
GFR calc non Af Amer: >90 mL/min (ref >90)
GLUCOSE: 102 mg/dL — AB (ref 70–99)
Potassium: 4.1 mmol/L (ref 3.5–5.1)
SODIUM: 138 mmol/L (ref 135–145)
TOTAL PROTEIN: 7.2 g/dL (ref 6.0–8.3)
Total Bilirubin: 0.6 mg/dL (ref 0.3–1.2)

## 2014-07-31 LAB — CBC WITH DIFFERENTIAL/PLATELET
Basophils Absolute: 0.1 10*3/uL (ref 0.0–0.1)
Basophils Relative: 0 % (ref 0–1)
EOS ABS: 0.6 10*3/uL (ref 0.0–0.7)
EOS PCT: 4 % (ref 0–5)
HEMATOCRIT: 43.1 % (ref 39.0–52.0)
HEMOGLOBIN: 15.1 g/dL (ref 13.0–17.0)
LYMPHS ABS: 3.3 10*3/uL (ref 0.7–4.0)
LYMPHS PCT: 24 % (ref 12–46)
MCH: 29.6 pg (ref 26.0–34.0)
MCHC: 35 g/dL (ref 30.0–36.0)
MCV: 84.5 fL (ref 78.0–100.0)
MONO ABS: 1.4 10*3/uL — AB (ref 0.1–1.0)
MONOS PCT: 10 % (ref 3–12)
Neutro Abs: 8.7 10*3/uL — ABNORMAL HIGH (ref 1.7–7.7)
Neutrophils Relative %: 62 % (ref 43–77)
Platelets: 308 10*3/uL (ref 150–400)
RBC: 5.1 MIL/uL (ref 4.22–5.81)
RDW: 13 % (ref 11.5–15.5)
WBC: 14 10*3/uL — AB (ref 4.0–10.5)

## 2014-07-31 LAB — CBC AND DIFFERENTIAL: WBC: 11 10^3/mL

## 2014-07-31 MED ORDER — HYDROMORPHONE HCL 1 MG/ML IJ SOLN
1.0000 mg | Freq: Once | INTRAMUSCULAR | Status: AC
  Administered 2014-07-31: 1 mg via INTRAVENOUS
  Filled 2014-07-31: qty 1

## 2014-07-31 NOTE — ED Provider Notes (Addendum)
CSN: [id number]     Arrival date & time 07/31/14  1946 History   First MD Initiated Contact with Patient 07/31/14 2204     Chief Complaint  Patient presents with  . Back Pain     Patient is a 24 y.o. male presenting with back pain. The history is provided by the patient. No language interpreter was used.  Back Pain  Mr. Peek  Presents for evaluation of back pain. He has a history of scoliosis status post Harrington rod placement when he was 16. He's had midthoracic back pain for the last 6 days. The pain is sharp and constant in nature. It radiates throughout the whole thoracic back. He denies any fevers, chest pain, shortness of breath, abdominal pain. He does have some occasional nausea. He reports chronic numbness in BLE.   He's had increased weakness and bilateral lower extremities since his pain began. 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 He takes no medications currently. He was referred to the ER for CT scan of his back. He denies any dysuria or urinary incontinence but states that he now has to sit down to urinate which is new for him.  Past Medical History  Diagnosis Date  . Scoliosis (and kyphoscoliosis), idiopathic   . 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 Attention deficit disorder with hyperactivity(314.01)   . Other acne   . CVA (cerebrovascular accident)     after scollosis repair  . 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 Harrington rods    . Inguinal hernia repair    . Congenital heart surgery  1994    Questionable tetralogy of flow repair versus VSD  . Wrist surgery     Family History  Problem Relation Age of Onset  . Hypertension Father   . Heart attack Father   .  Hyperlipidemia Father    History  Substance Use Topics  . Smoking status: Never Smoker   . Smokeless tobacco: Not on file  . Alcohol Use: Yes     Comment: occasional    Review of Systems  Musculoskeletal: Positive for back pain.  All other systems reviewed and are negative.     Allergies  Review of patient's allergies indicates no known allergies.  Home Medications   Prior to Admission medications   Medication Sig Start Date End Date Taking? Authorizing Provider  cyclobenzaprine (FLEXERIL) 5 MG tablet Take 1 tablet (5 mg total) by mouth 3 (three) times daily as needed for muscle spasms. 07/27/14  Yes Ethelda Chick, MD  naproxen (NAPROSYN) 500 MG tablet Take 1 tablet (500 mg total) by mouth 2 (two) times daily. 07/27/14  Yes Ethelda Chick, MD  traMADol (ULTRAM) 50 MG tablet Take 50 mg by mouth 4 (four) times daily.   Yes Historical Provider, MD  oxyCODONE-acetaminophen (PERCOCET/ROXICET) 5-325 MG per tablet Take 1-2 tablets by mouth every 6 (six) hours as needed for severe pain. Patient not taking: Reported on 07/31/2014 07/27/14   Ethelda Chick, MD   BP 124/57 mmHg  Pulse 86  Temp(Src) 97.8 F (36.6 C) (Oral)  Resp 16  SpO2 99% Physical Exam  Constitutional: He is oriented to person, place, and time. He appears well-developed  and well-nourished.  HENT:  Head: Normocephalic and atraumatic.  Cardiovascular: Normal rate and regular rhythm.   Pulmonary/Chest: Effort normal and breath sounds normal. No respiratory distress.  Abdominal: Soft. He exhibits no distension. There is no tenderness. There is no rebound and no guarding.  Musculoskeletal:   Chronic deformity to the left distal upper extremity. There is diffuse thoracic tenderness to palpation. Scoliosis present with well healed surgical scar.  Neurological: He is alert and oriented to person, place, and time.   4+ to 5 out of 5 strength in BLE.  Decreased sensation to light touch in BLE.  No saddle anesthesia.  2+  patellar reflexes bilaterally  Skin: Skin is warm and dry.  Psychiatric: He has a normal mood and affect. His behavior is normal.  Nursing note and vitals reviewed.   ED Course  Procedures (including critical care time) Labs Review Labs Reviewed  COMPREHENSIVE METABOLIC PANEL - Abnormal; Notable for the following:    Glucose, Bld 102 (*)    All other components within normal limits  CBC WITH DIFFERENTIAL/PLATELET - Abnormal; Notable for the following:    WBC 14.0 (*)    Neutro Abs 8.7 (*)    Monocytes Absolute 1.4 (*)    All other components within normal limits  URINALYSIS, ROUTINE W REFLEX MICROSCOPIC    Imaging Review Ct Thoracic Spine Wo Contrast  07/31/2014   CLINICAL DATA:  24 year old male with back spasms and pain. An contents. Initial encounter.  EXAM: CT THORACIC SPINE WITHOUT CONTRAST  TECHNIQUE: Multidetector CT imaging of the thoracic spine was performed without intravenous contrast administration. Multiplanar CT image reconstructions were also generated.  COMPARISON:  None.  FINDINGS: Severe S-shaped thoracic scoliosis. Bilateral posterior spinal rods with intermittent laminar hooks and transpedicular screws. Hardware intact. Exaggerated thoracic kyphosis. Severe rotatory component of scoliosis. Widespread posterior element arthrodesis. The T12-L1 level remains unfused. No acute osseous abnormality identified. Hardware streak artifact. No CT evidence of spinal stenosis at the levels where the thecal sac is visible.  Negative visualized thoracic and upper abdominal noncontrast viscera.  IMPRESSION: Severe thoracic scoliosis and extensive postoperative changes with widespread thoracic ankylosis. No acute osseous abnormality identified. No CT evidence of thoracic spinal stenosis.   Electronically Signed   By: Odessa Fleming M.D.   On: 07/31/2014 23:42     EKG Interpretation None      MDM   Final diagnoses:  Midline thoracic back pain    Patient with history of lower extremity  weakness as well as Harrington rod placement for severe scoliosis here for evaluation of 1 week of back pain with popping sensation and muscle spasms. He does have diminished sensation to light touch in the lower extremities which is chronic for him per patient. Patient does have some lower extremity weakness which he states is increased from his baseline but on review of prior notes in care evertwgere patient does have a history of lower extremity weakness and bilateral lower extremities following his surgery. CT scan obtained to evaluate for hardware malfunction, CT scan does not show any acute abnormalities. Discussed with patient option of MRI and he states he is unable to get an MRI due to his surgery. Patient does have mild leukocytosis on prior visit and today's visit. There is no evidence of acute infectious process at this time and patient does not have any symptoms of acute infection currently. He denies any fevers or night sweats. He has no history of IV drug use or recent surgical instrumentation. Discussed with  patient's findings of CT scan and labs and home pain control. Also discussed close return precautions for evidence of developing infection.    Tilden FossaElizabeth Jorja Empie, MD 08/01/14 [id number]  Tilden FossaElizabeth Hoa Briggs, MD 08/01/14 [phone]

## 2014-07-31 NOTE — ED Notes (Signed)
Pt taken to Ct. 

## 2014-07-31 NOTE — ED Notes (Signed)
Pt. reports persistent low back pain , lost his balance and fell today , seen here 4 days ago prescribed with pain medications with no rel;irf , advised by his PCP to go to ER for CT scan .

## 2014-07-31 NOTE — ED Notes (Signed)
Pt reports back pain "all over" since Sunday and does not recall injuring it.  Pt is having trouble ambulating and bending.  Pt alert and oriented.

## 2014-08-01 DIAGNOSIS — M546 Pain in thoracic spine: Secondary | ICD-10-CM | POA: Diagnosis not present

## 2014-08-01 MED ORDER — KETOROLAC TROMETHAMINE 15 MG/ML IJ SOLN
15.0000 mg | Freq: Once | INTRAMUSCULAR | Status: AC
  Administered 2014-08-01: 15 mg via INTRAVENOUS
  Filled 2014-08-01: qty 1

## 2014-08-01 MED ORDER — ONDANSETRON HCL 4 MG/2ML IJ SOLN
4.0000 mg | Freq: Once | INTRAMUSCULAR | Status: AC
  Administered 2014-08-01: 4 mg via INTRAVENOUS
  Filled 2014-08-01: qty 2

## 2014-08-01 MED ORDER — HYDROMORPHONE HCL 1 MG/ML IJ SOLN
1.0000 mg | Freq: Once | INTRAMUSCULAR | Status: AC
  Administered 2014-08-01: 1 mg via INTRAVENOUS
  Filled 2014-08-01: qty 1

## 2014-08-01 MED ORDER — LIDOCAINE 5 % EX PTCH: 1.0000 | MEDICATED_PATCH | CUTANEOUS | Status: AC

## 2014-08-01 MED ORDER — OXYCODONE-ACETAMINOPHEN 5-325 MG PO TABS: 1.0000 | ORAL_TABLET | ORAL | Status: AC | PRN

## 2014-08-01 NOTE — ED Notes (Signed)
Pt made aware to return if symptoms worsen or if any life threatening symptoms occur.   

## 2014-08-01 NOTE — Discharge Instructions (Signed)
Get rechecked immediately if you develop fevers or new concerning symptoms.     Back Pain, Adult Low back pain is very common. About 1 in 5 people have back pain.The cause of low back pain is rarely dangerous. The pain often gets better over time.About half of people with a sudden onset of back pain feel better in just 2 weeks. About 8 in 10 people feel better by 6 weeks.  CAUSES Some common causes of back pain include:  Strain of the muscles or ligaments supporting the spine.  Wear and tear (degeneration) of the spinal discs.  Arthritis.  Direct injury to the back. DIAGNOSIS Most of the time, the direct cause of low back pain is not known.However, back pain can be treated effectively even when the exact cause of the pain is unknown.Answering your caregiver's questions about your overall health and symptoms is one of the most accurate ways to make sure the cause of your pain is not dangerous. If your caregiver needs more information, he or she may order lab work or imaging tests (X-rays or MRIs).However, even if imaging tests show changes in your back, this usually does not require surgery. HOME CARE INSTRUCTIONS For many people, back pain returns.Since low back pain is rarely dangerous, it is often a condition that people can learn to [facility] their own.   Remain active. It is stressful on the back to sit or stand in one place. Do not sit, drive, or stand in one place for more than 30 minutes at a time. Take short walks on level surfaces as soon as pain allows.Try to increase the length of time you walk each day.  Do not stay in bed.Resting more than 1 or 2 days can delay your recovery.  Do not avoid exercise or work.Your body is made to move.It is not dangerous to be active, even though your back may hurt.Your back will likely heal faster if you return to being active before your pain is gone.  Pay attention to your body when you bend and lift. Many people have less  discomfortwhen lifting if they bend their knees, keep the load close to their bodies,and avoid twisting. Often, the most comfortable positions are those that put less stress on your recovering back.  Find a comfortable position to sleep. Use a firm mattress and lie on your side with your knees slightly bent. If you lie on your back, put a pillow under your knees.  Only take over-the-counter or prescription medicines as directed by your caregiver. Over-the-counter medicines to reduce pain and inflammation are often the most helpful.Your caregiver may prescribe muscle relaxant drugs.These medicines help dull your pain so you can more quickly return to your normal activities and healthy exercise.  Put ice on the injured area.  Put ice in a plastic bag.  Place a towel between your skin and the bag.  Leave the ice on for 15-20 minutes, 03-04 times a day for the first 2 to 3 days. After that, ice and heat may be alternated to reduce pain and spasms.  Ask your caregiver about trying back exercises and gentle massage. This may be of some benefit.  Avoid feeling anxious or stressed.Stress increases muscle tension and can worsen back pain.It is important to recognize when you are anxious or stressed and learn ways to manage it.Exercise is a great option. SEEK MEDICAL CARE IF:  You have pain that is not relieved with rest or medicine.  You have pain that does not improve in  1 week.  You have new symptoms.  You are generally not feeling well. SEEK IMMEDIATE MEDICAL CARE IF:   You have pain that radiates from your back into your legs.  You develop new bowel or bladder control problems.  You have unusual weakness or numbness in your arms or legs.  You develop nausea or vomiting.  You develop abdominal pain.  You feel faint. Document Released: 06/05/2005 Document Revised: 12/05/2011 Document Reviewed: 10/07/2013 [facility] Patient Information 2015 [place], [place]. This information is  not intended to replace advice given to you by your health care provider. Make sure you discuss any questions you have with your health care provider. Muscle Cramps and Spasms Muscle cramps and spasms occur when a muscle or muscles tighten and you have no control over this tightening (involuntary muscle contraction). They are a common problem and can develop in any muscle. The most common place is in the calf muscles of the leg. Both muscle cramps and muscle spasms are involuntary muscle contractions, but they also have differences:   Muscle cramps are sporadic and painful. They may last a few seconds to a quarter of an hour. Muscle cramps are often more forceful and last longer than muscle spasms.  Muscle spasms may or may not be painful. They may also last just a few seconds or much longer. CAUSES  It is uncommon for cramps or spasms to be due to a serious underlying problem. In many cases, the cause of cramps or spasms is unknown. Some common causes are:   Overexertion.   Overuse from repetitive motions (doing the same thing over and over).   Remaining in a certain position for a long period of time.   Improper preparation, form, or technique while performing a sport or activity.   Dehydration.   Injury.   Side effects of some medicines.   Abnormally low levels of the salts and ions in your blood (electrolytes), especially potassium and calcium. This could happen if you are taking water pills (diuretics) or you are pregnant.  Some underlying medical problems can make it more likely to develop cramps or spasms. These include, but are not limited to:   Diabetes.   Parkinson disease.   Hormone disorders, such as thyroid problems.   Alcohol abuse.   Diseases specific to muscles, joints, and bones.   Blood vessel disease where not enough blood is getting to the muscles.  HOME CARE INSTRUCTIONS   Stay well hydrated. Drink enough water and fluids to keep your urine  clear or pale yellow.  It may be helpful to massage, stretch, and relax the affected muscle.  For tight or tense muscles, use a warm towel, heating pad, or hot shower water directed to the affected area.  If you are sore or have pain after a cramp or spasm, applying ice to the affected area may relieve discomfort.  Put ice in a plastic bag.  Place a towel between your skin and the bag.  Leave the ice on for 15-20 minutes, 03-04 times a day.  Medicines used to treat a known cause of cramps or spasms may help reduce their frequency or severity. Only take over-the-counter or prescription medicines as directed by your caregiver. SEEK MEDICAL CARE IF:  Your cramps or spasms get more severe, more frequent, or do not improve over time.  MAKE SURE YOU:   Understand these instructions.  Will watch your condition.  Will get help right away if you are not doing well or get worse. Document  Released: 11/25/2001 Document Revised: 09/30/2012 Document Reviewed: 05/22/2012 [facility] Patient Information 2015 [place], [place]. This information is not intended to replace advice given to you by your health care provider. Make sure you discuss any questions you have with your health care provider.

## 2014-08-12 ENCOUNTER — Ambulatory Visit: Payer: Self-pay | Admitting: Family Medicine

## 2014-09-19 DIAGNOSIS — Q7150 Longitudinal reduction defect of unspecified ulna: Secondary | ICD-10-CM | POA: Insufficient documentation

## 2014-09-19 DIAGNOSIS — D729 Disorder of white blood cells, unspecified: Secondary | ICD-10-CM | POA: Insufficient documentation

## 2014-09-19 DIAGNOSIS — H6120 Impacted cerumen, unspecified ear: Secondary | ICD-10-CM | POA: Insufficient documentation

## 2014-09-19 DIAGNOSIS — H6190 Disorder of external ear, unspecified, unspecified ear: Secondary | ICD-10-CM | POA: Insufficient documentation

## 2014-09-19 DIAGNOSIS — Q249 Congenital malformation of heart, unspecified: Secondary | ICD-10-CM | POA: Insufficient documentation

## 2014-09-19 DIAGNOSIS — H543 Unqualified visual loss, both eyes: Secondary | ICD-10-CM | POA: Insufficient documentation

## 2014-09-19 DIAGNOSIS — M545 Low back pain, unspecified: Secondary | ICD-10-CM | POA: Insufficient documentation

## 2014-09-19 DIAGNOSIS — Z709 Sex counseling, unspecified: Secondary | ICD-10-CM | POA: Insufficient documentation

## 2014-09-19 DIAGNOSIS — H9193 Unspecified hearing loss, bilateral: Secondary | ICD-10-CM | POA: Insufficient documentation

## 2014-09-19 DIAGNOSIS — Z8774 Personal history of (corrected) congenital malformations of heart and circulatory system: Secondary | ICD-10-CM | POA: Insufficient documentation

## 2014-09-19 DIAGNOSIS — Z8673 Personal history of transient ischemic attack (TIA), and cerebral infarction without residual deficits: Secondary | ICD-10-CM | POA: Insufficient documentation

## 2014-09-19 DIAGNOSIS — D72828 Other elevated white blood cell count: Secondary | ICD-10-CM | POA: Insufficient documentation

## 2014-09-19 DIAGNOSIS — G8929 Other chronic pain: Secondary | ICD-10-CM | POA: Insufficient documentation

## 2014-09-19 DIAGNOSIS — H61899 Other specified disorders of external ear, unspecified ear: Secondary | ICD-10-CM | POA: Insufficient documentation

## 2014-09-19 DIAGNOSIS — R002 Palpitations: Secondary | ICD-10-CM | POA: Insufficient documentation

## 2014-09-19 DIAGNOSIS — Z981 Arthrodesis status: Secondary | ICD-10-CM | POA: Insufficient documentation

## 2014-09-19 DIAGNOSIS — Q210 Ventricular septal defect: Secondary | ICD-10-CM | POA: Insufficient documentation

## 2014-09-29 ENCOUNTER — Encounter: Payer: Self-pay | Admitting: Family Medicine

## 2014-11-18 ENCOUNTER — Ambulatory Visit: Payer: Self-pay | Admitting: Family Medicine

## 2014-11-20 ENCOUNTER — Telehealth: Payer: Self-pay | Admitting: Family Medicine

## 2014-11-20 NOTE — Telephone Encounter (Signed)
Pt had a appointment on Wednesday that he did not come to and we have never seen him for wrist pain.  He will need to f/u with 1st available appointment.

## 2014-11-20 NOTE — Telephone Encounter (Signed)
Pt scheduled his appointment for December 11, 2014.

## 2014-12-11 ENCOUNTER — Ambulatory Visit: Payer: Medicare Other | Admitting: Family Medicine

## 2016-10-31 ENCOUNTER — Encounter (HOSPITAL_COMMUNITY): Payer: Self-pay

## 2016-10-31 ENCOUNTER — Emergency Department (HOSPITAL_COMMUNITY)
Admission: EM | Admit: 2016-10-31 | Discharge: 2016-10-31 | Disposition: A | Discharge status: Home / Self Care | Payer: Medicare Other | Attending: Emergency Medicine | Admitting: Emergency Medicine

## 2016-10-31 ENCOUNTER — Emergency Department (HOSPITAL_COMMUNITY): Payer: Medicare Other

## 2016-10-31 DIAGNOSIS — F909 Attention-deficit hyperactivity disorder, unspecified type: Secondary | ICD-10-CM | POA: Diagnosis not present

## 2016-10-31 DIAGNOSIS — M25531 Pain in right wrist: Secondary | ICD-10-CM | POA: Diagnosis not present

## 2016-10-31 DIAGNOSIS — Y92410 Unspecified street and highway as the place of occurrence of the external cause: Secondary | ICD-10-CM | POA: Diagnosis not present

## 2016-10-31 DIAGNOSIS — Z8673 Personal history of transient ischemic attack (TIA), and cerebral infarction without residual deficits: Secondary | ICD-10-CM | POA: Diagnosis not present

## 2016-10-31 DIAGNOSIS — Y939 Activity, unspecified: Secondary | ICD-10-CM | POA: Diagnosis not present

## 2016-10-31 DIAGNOSIS — Y999 Unspecified external cause status: Secondary | ICD-10-CM | POA: Diagnosis not present

## 2016-10-31 DIAGNOSIS — S299XXA Unspecified injury of thorax, initial encounter: Secondary | ICD-10-CM | POA: Diagnosis present

## 2016-10-31 DIAGNOSIS — M546 Pain in thoracic spine: Secondary | ICD-10-CM | POA: Insufficient documentation

## 2016-10-31 NOTE — ED Notes (Signed)
Bed: WTR6 Expected date:  Expected time:  Means of arrival:  Comments: 

## 2016-10-31 NOTE — Discharge Instructions (Signed)
Please read attached information. If you experience any new or worsening signs or symptoms please return to the emergency room for evaluation. Please follow-up with your primary care provider or specialist as discussed.  °
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
